# Patient Record
Sex: Female | Born: 1940 | Race: Black or African American | Hispanic: No | Marital: Married | Smoking: Never smoker
Health system: Southern US, Community
[De-identification: ages and names within clinical notes are randomized; demographics above are authoritative.]

## PROBLEM LIST (undated history)

## (undated) DIAGNOSIS — I10 Essential (primary) hypertension: Secondary | ICD-10-CM

## (undated) HISTORY — PX: ROTATOR CUFF REPAIR: SHX139

## (undated) HISTORY — PX: ABDOMINAL SURGERY: SHX537

## (undated) HISTORY — PX: INNER EAR SURGERY: SHX679

---

## 2012-05-27 ENCOUNTER — Ambulatory Visit (INDEPENDENT_AMBULATORY_CARE_PROVIDER_SITE_OTHER): Payer: Medicare Other | Admitting: Otolaryngology

## 2012-05-27 DIAGNOSIS — H7010 Chronic mastoiditis, unspecified ear: Secondary | ICD-10-CM

## 2012-05-27 DIAGNOSIS — H906 Mixed conductive and sensorineural hearing loss, bilateral: Secondary | ICD-10-CM

## 2012-05-27 DIAGNOSIS — H95129 Granulation of postmastoidectomy cavity, unspecified ear: Secondary | ICD-10-CM

## 2012-06-17 ENCOUNTER — Ambulatory Visit (INDEPENDENT_AMBULATORY_CARE_PROVIDER_SITE_OTHER): Payer: Medicare Other | Admitting: Otolaryngology

## 2012-06-17 DIAGNOSIS — H70009 Acute mastoiditis without complications, unspecified ear: Secondary | ICD-10-CM

## 2012-06-17 DIAGNOSIS — H903 Sensorineural hearing loss, bilateral: Secondary | ICD-10-CM

## 2012-07-15 ENCOUNTER — Ambulatory Visit (INDEPENDENT_AMBULATORY_CARE_PROVIDER_SITE_OTHER): Payer: Medicare Other | Admitting: Otolaryngology

## 2012-07-15 DIAGNOSIS — H95129 Granulation of postmastoidectomy cavity, unspecified ear: Secondary | ICD-10-CM

## 2012-10-14 ENCOUNTER — Ambulatory Visit (INDEPENDENT_AMBULATORY_CARE_PROVIDER_SITE_OTHER): Payer: Medicare Other | Admitting: Otolaryngology

## 2012-12-09 ENCOUNTER — Ambulatory Visit (INDEPENDENT_AMBULATORY_CARE_PROVIDER_SITE_OTHER): Payer: Medicare Other | Admitting: Otolaryngology

## 2013-05-26 ENCOUNTER — Ambulatory Visit (INDEPENDENT_AMBULATORY_CARE_PROVIDER_SITE_OTHER): Payer: Medicare Other | Admitting: Otolaryngology

## 2013-05-26 DIAGNOSIS — T169XXA Foreign body in ear, unspecified ear, initial encounter: Secondary | ICD-10-CM

## 2013-05-26 DIAGNOSIS — H608X9 Other otitis externa, unspecified ear: Secondary | ICD-10-CM

## 2013-05-26 DIAGNOSIS — H6060 Unspecified chronic otitis externa, unspecified ear: Secondary | ICD-10-CM

## 2013-12-08 ENCOUNTER — Emergency Department (HOSPITAL_COMMUNITY): Payer: Medicare Other

## 2013-12-08 ENCOUNTER — Encounter (HOSPITAL_COMMUNITY): Payer: Self-pay | Admitting: Emergency Medicine

## 2013-12-08 ENCOUNTER — Emergency Department (HOSPITAL_COMMUNITY)
Admission: EM | Admit: 2013-12-08 | Discharge: 2013-12-08 | Disposition: A | Discharge status: Home / Self Care | Payer: Medicare Other | Attending: Emergency Medicine | Admitting: Emergency Medicine

## 2013-12-08 DIAGNOSIS — Z79899 Other long term (current) drug therapy: Secondary | ICD-10-CM | POA: Diagnosis not present

## 2013-12-08 DIAGNOSIS — M25551 Pain in right hip: Secondary | ICD-10-CM

## 2013-12-08 DIAGNOSIS — M25559 Pain in unspecified hip: Secondary | ICD-10-CM | POA: Insufficient documentation

## 2013-12-08 DIAGNOSIS — Z7982 Long term (current) use of aspirin: Secondary | ICD-10-CM | POA: Diagnosis not present

## 2013-12-08 DIAGNOSIS — R52 Pain, unspecified: Secondary | ICD-10-CM | POA: Insufficient documentation

## 2013-12-08 DIAGNOSIS — I10 Essential (primary) hypertension: Secondary | ICD-10-CM | POA: Diagnosis not present

## 2013-12-08 HISTORY — DX: Essential (primary) hypertension: I10

## 2013-12-08 MED ORDER — TRAMADOL HCL 50 MG PO TABS: 50.0000 mg | ORAL_TABLET | Freq: Four times a day (QID) | ORAL | Status: AC | PRN

## 2013-12-08 MED ORDER — TRAMADOL HCL 50 MG PO TABS
50.0000 mg | ORAL_TABLET | Freq: Once | ORAL | Status: AC
  Administered 2013-12-08: 50 mg via ORAL
  Filled 2013-12-08: qty 1

## 2013-12-08 NOTE — ED Notes (Signed)
Pt reports right hip pain x1.5 weeks. Pt reports went to sit down in a chair and fall backwards. Pt reports right hip pain ever since. nad noted. Pt denies loc or hitting head.

## 2013-12-08 NOTE — Discharge Instructions (Signed)
Hip Pain Your hip is the joint between your upper legs and your lower pelvis. The bones, cartilage, tendons, and muscles of your hip joint perform a lot of work each day supporting your body weight and allowing you to move around. Hip pain can range from a minor ache to severe pain in one or both of your hips. Pain may be felt on the inside of the hip joint near the groin, or the outside near the buttocks and upper thigh. You may have swelling or stiffness as well.  HOME CARE INSTRUCTIONS   Take medicines only as directed by your health care provider.  Apply ice to the injured area:  Put ice in a plastic bag.  Place a towel between your skin and the bag.  Leave the ice on for 15-20 minutes at a time, 3-4 times a day.  Keep your leg raised (elevated) when possible to lessen swelling.  Avoid activities that cause pain.  Follow specific exercises as directed by your health care provider.  Sleep with a pillow between your legs on your most comfortable side.  Record how often you have hip pain, the location of the pain, and what it feels like. SEEK MEDICAL CARE IF:   You are unable to put weight on your leg.  Your hip is red or swollen or very tender to touch.  Your pain or swelling continues or worsens after 1 week.  You have increasing difficulty walking.  You have a fever. SEEK IMMEDIATE MEDICAL CARE IF:   You have fallen.  You have a sudden increase in pain and swelling in your hip. MAKE SURE YOU:   Understand these instructions.  Will watch your condition.  Will get help right away if you are not doing well or get worse. Document Released: 09/25/2009 Document Revised: 08/22/2013 Document Reviewed: 12/02/2012 [employer] Patient Information 2015 [employer]. This information is not intended to replace advice given to you by your health care provider. Make sure you discuss any questions you have with your health care provider.  

## 2013-12-08 NOTE — ED Notes (Signed)
Pt states she fell 7-10 days ago and her hip has been hurting ever since. States she has been managing the pain with anacin but that it has not gone away so she came to ED. Pt denies hitting her head or losing consciousness. NAD.

## 2013-12-08 NOTE — ED Notes (Signed)
Pt discharged to home with family. Pt's BP remains high but pt feels fine. Pt given warning signs for stroke and has verbalized understanding. Pt also understands that she needs to follow up with primary care doctor to evaluate BP. NAD.

## 2013-12-08 NOTE — ED Provider Notes (Signed)
CSN: [id number]     Arrival date & time 12/08/13  0825 History   First MD Initiated Contact with Patient 12/08/13 [phone]     Chief Complaint  Patient presents with  . Hip Pain   Whitney Mcbride is a 73 y.o. female who presents to the Emergency Department complaining of posterior right hip pain after a fall from a chair one week ago.  She denies other injuries.  Pain improves with aspirin and after ambulation.    (Consider location/radiation/quality/duration/timing/severity/associated sxs/prior Treatment) Patient is a 73 y.o. female presenting with hip pain.  Hip Pain This is a new problem. Episode onset: one week ago. The problem occurs constantly. The problem has been unchanged. Associated symptoms include arthralgias. Pertinent negatives include no abdominal pain, chest pain, chills, fever, headaches, joint swelling, nausea, neck pain, numbness, rash, vertigo, vomiting or weakness. The symptoms are aggravated by walking and standing. She has tried NSAIDs for the symptoms. The treatment provided mild relief.    Past Medical History  Diagnosis Date  . Hypertension    Past Surgical History  Procedure Laterality Date  . Rotator cuff repair    . Abdominal surgery    . Inner ear surgery     History reviewed. No pertinent family history. History  Substance Use Topics  . Smoking status: Never Smoker   . Smokeless tobacco: Not on file  . Alcohol Use: No   OB History   Grav Para Term Preterm Abortions TAB SAB Ect Mult Living                 Review of Systems  Constitutional: Negative for fever and chills.  Respiratory: Negative for chest tightness.   Cardiovascular: Negative for chest pain.  Gastrointestinal: Negative for nausea, vomiting and abdominal pain.  Genitourinary: Negative for dysuria, hematuria and difficulty urinating.  Musculoskeletal: Positive for arthralgias. Negative for joint swelling and neck pain.       Right hip pain  Skin: Negative for color change, rash and  wound.  Neurological: Negative for vertigo, weakness, numbness and headaches.  All other systems reviewed and are negative.     Allergies  Review of patient's allergies indicates no known allergies.  Home Medications   Prior to Admission medications   Medication Sig Start Date End Date Taking? Authorizing Provider  Aspirin (ANACIN PO) Take 1 tablet by mouth daily as needed (pain).   Yes Historical Provider, MD  losartan-hydrochlorothiazide (HYZAAR) 50-12.5 MG per tablet Take 1 tablet by mouth daily.   Yes Historical Provider, MD  nystatin cream (MYCOSTATIN) Apply 1 application topically daily as needed (under breasts).   Yes Historical Provider, MD  omeprazole (PRILOSEC) 20 MG capsule Take 20 mg by mouth daily.   Yes Historical Provider, MD   BP 199/78  Pulse 83  Temp(Src) 98.2 F (36.8 C) (Oral)  Resp 18  Ht 5\' 5"  (1.651 m)  Wt 155 lb (70.308 kg)  BMI 25.79 kg/m2  SpO2 99% Physical Exam  Nursing note and vitals reviewed. Constitutional: She is oriented to person, place, and time. She appears well-developed and well-nourished. No distress.  HENT:  Head: Normocephalic and atraumatic.  Neck: Normal range of motion. Neck supple.  Cardiovascular: Normal rate, regular rhythm, normal heart sounds and intact distal pulses.   No murmur heard. Pulmonary/Chest: Effort normal and breath sounds normal. No respiratory distress. She exhibits no tenderness.  Abdominal: Soft. She exhibits no distension. There is no tenderness. There is no rebound and no guarding.  Musculoskeletal: She exhibits tenderness.  She exhibits no edema.       Lumbar back: She exhibits tenderness and pain. She exhibits normal range of motion, no swelling, no deformity, no laceration and normal pulse.  ttp of the posterior right hip.  No spinal tenderness.  DP pulses are brisk and symmetrical. Pt has full ROM of the right hip.   Distal sensation intact.  Hip Flexors/Extensors are intact.    Neurological: She is alert  and oriented to person, place, and time. She has normal strength. No sensory deficit. She exhibits normal muscle tone. Coordination and gait normal.  Reflex Scores:      Patellar reflexes are 2+ on the right side and 2+ on the left side.      Achilles reflexes are 2+ on the right side and 2+ on the left side. Skin: Skin is warm and dry. No rash noted.    ED Course  Procedures (including critical care time) Labs Review Labs Reviewed - No data to display  Imaging Review Dg Hip Complete Right  12/08/2013   CLINICAL DATA:  Right hip pain after fall.  EXAM: RIGHT HIP - COMPLETE 2+ VIEW  COMPARISON:  None.  FINDINGS: There is no evidence of hip fracture or dislocation. There is no evidence of arthropathy or other focal bone abnormality.  IMPRESSION: Normal right hip.   Electronically Signed   By: Roque LiasJames  Green M.D.   On: 12/08/2013 09:03     EKG Interpretation None      MDM   Final diagnoses:  Hip pain, acute, right      pt is alert, well appearing.  Ambulates in the dept with a steady gait.  No focal neuro deficits, has full ROM of the hip joint on exam and pain improves with aspirin.  No concerning sx's for septic joint.  Pain improved after medication.  rx's for ultram.  Pt advised that her BP here is elevated as well, but patient only complaint today is hip pain and states that her PMD is aware that her BP has been "up and down".  She appears stable for d/c and agrees to arrange close f/u with her PMD regarding her HTN.     Shanan Fitzpatrick L. Ardella Chhim, PA-C 12/10/13 0028

## 2013-12-12 NOTE — ED Provider Notes (Signed)
Medical screening examination/treatment/procedure(s) were performed by non-physician practitioner and as supervising physician I was immediately available for consultation/collaboration.   EKG Interpretation None        Amyra Vantuyl L Jayven Naill, MD 12/12/13 1702 

## 2015-12-20 IMAGING — CR DG HIP COMPLETE 2+V*R*
3 series · 3 of 3 positions shown · non-contrast
Comparison: None.

CLINICAL DATA: Right hip pain after fall.

EXAM:
RIGHT HIP - COMPLETE 2+ VIEW

[view not recorded (1 of 3)]
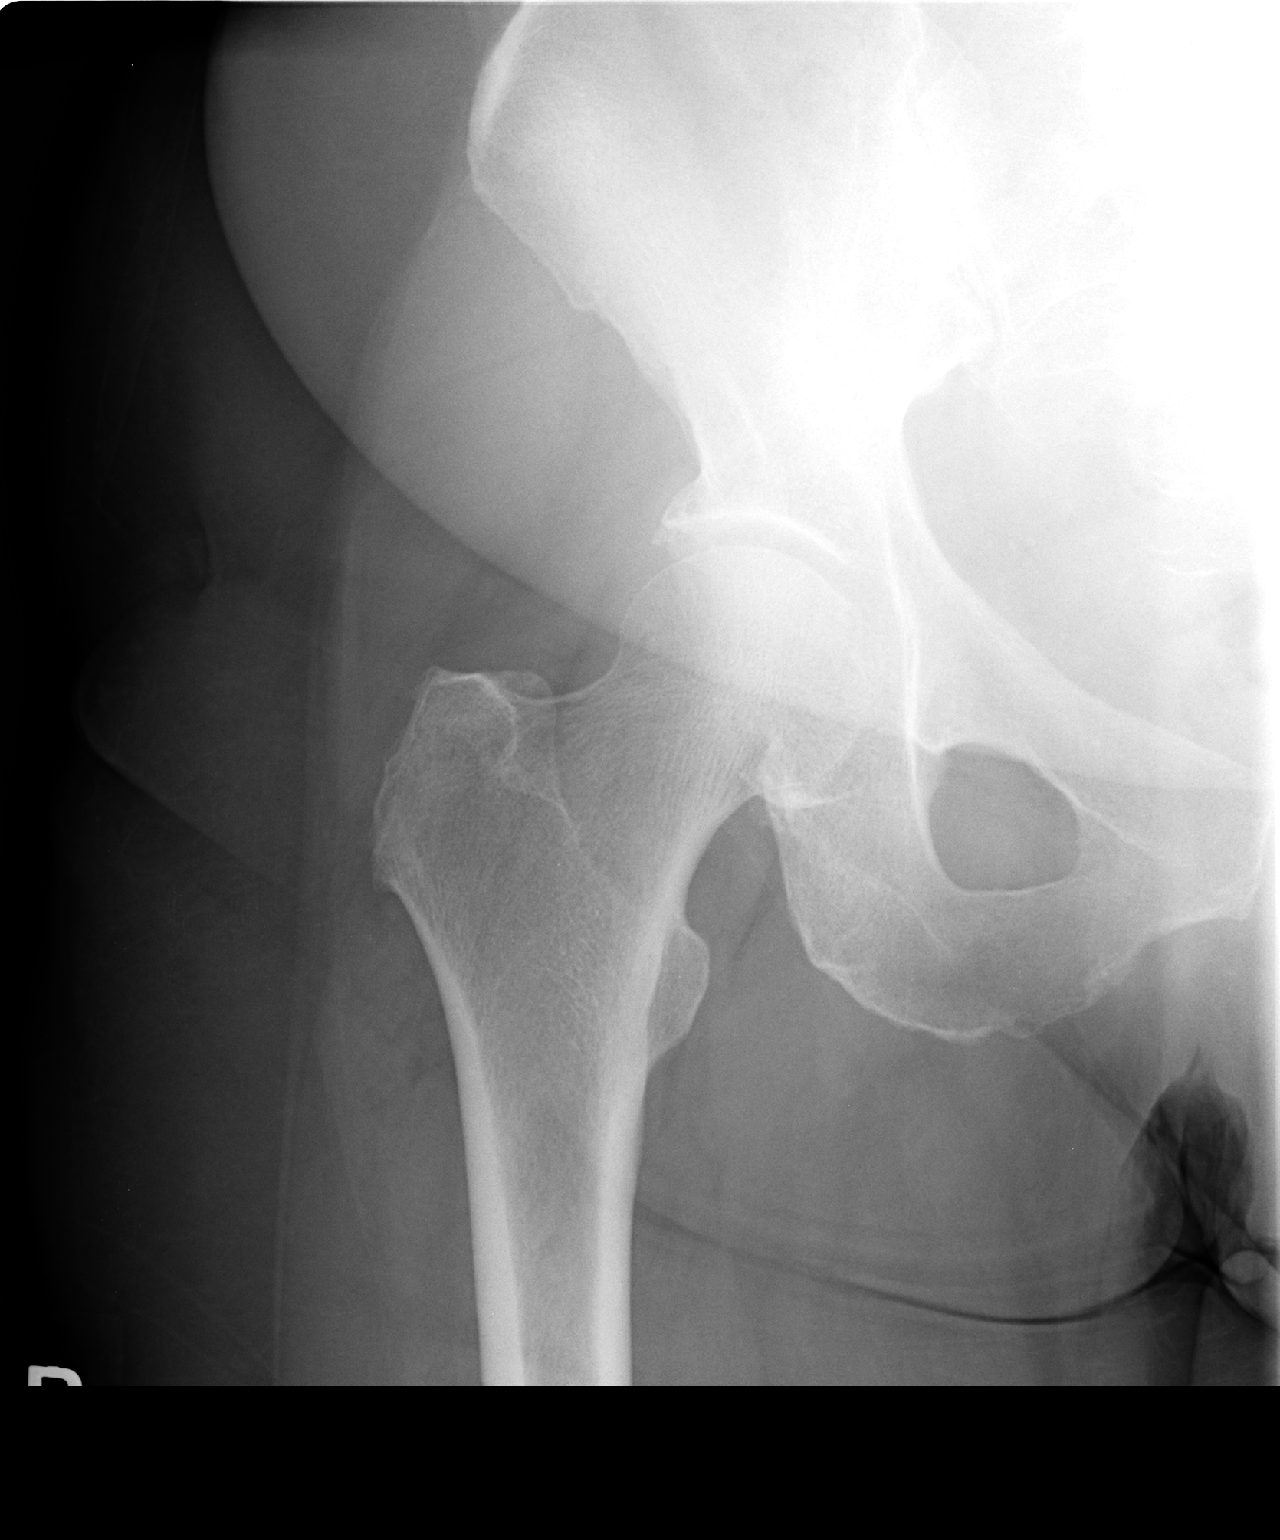

[view not recorded (2 of 3)]
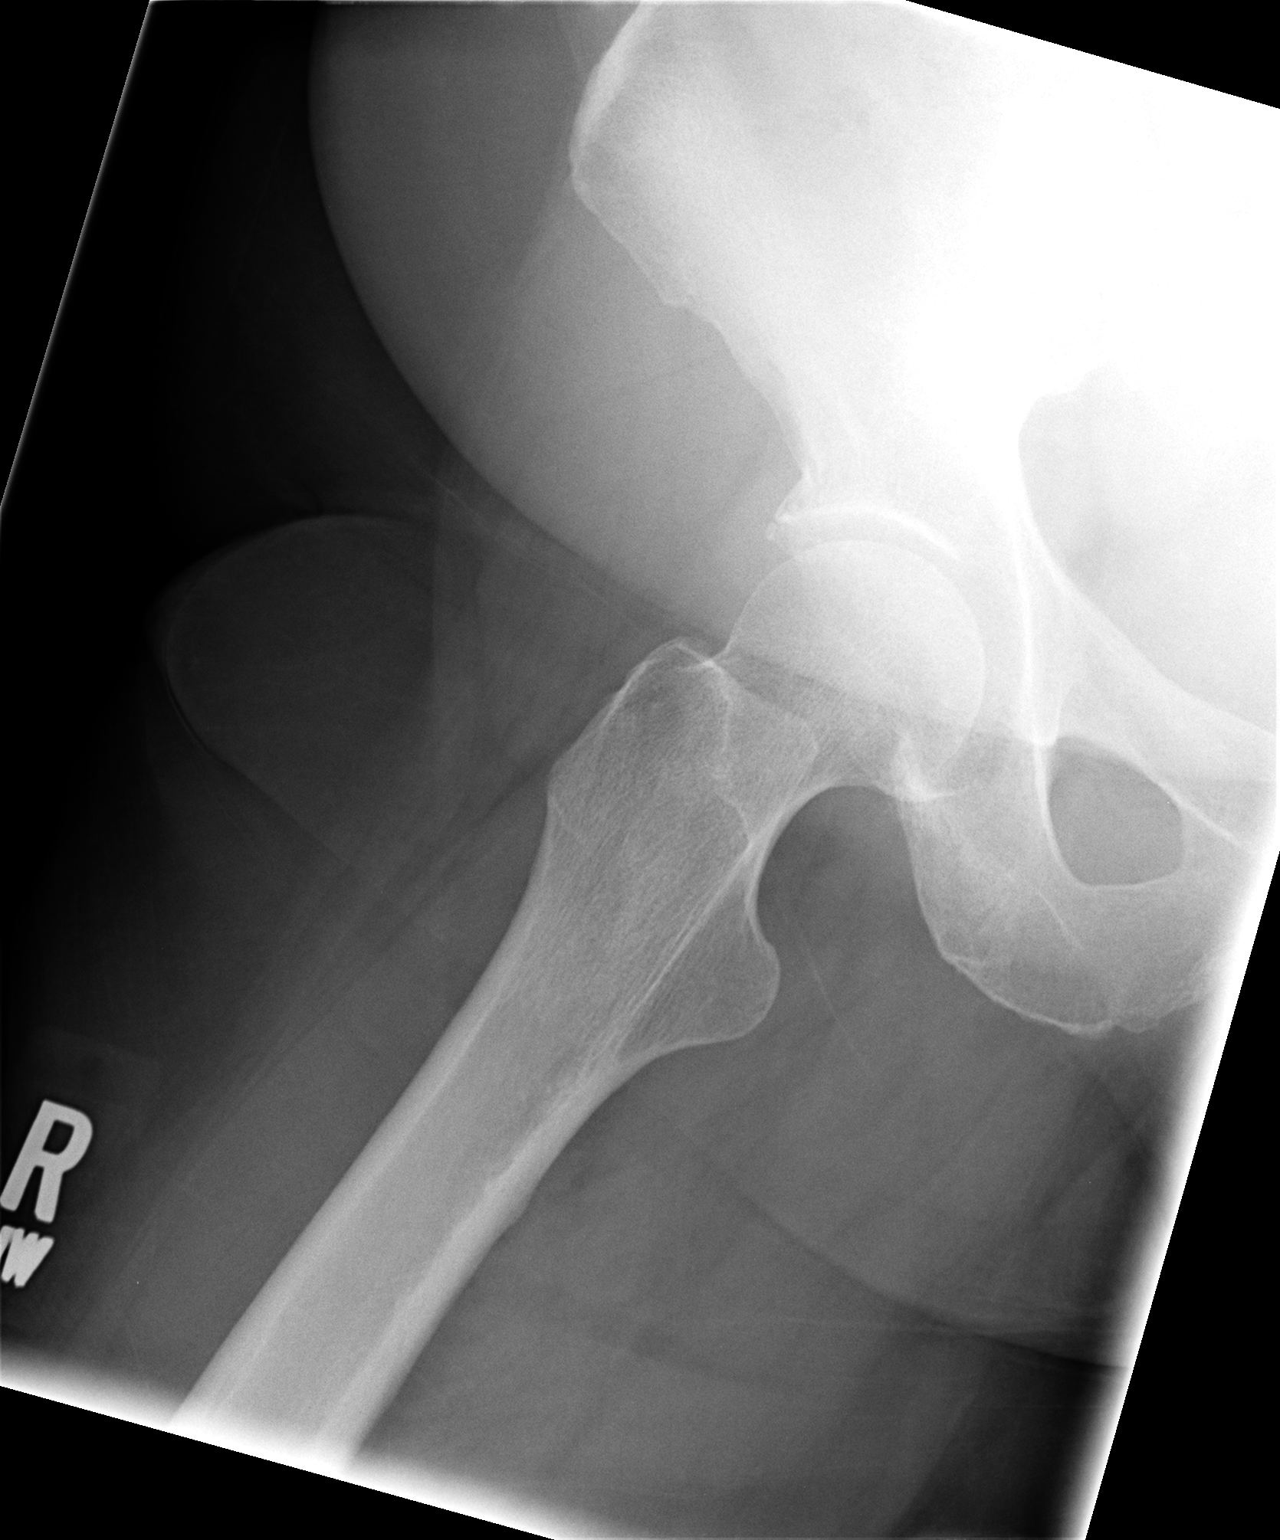

[view not recorded (3 of 3)]
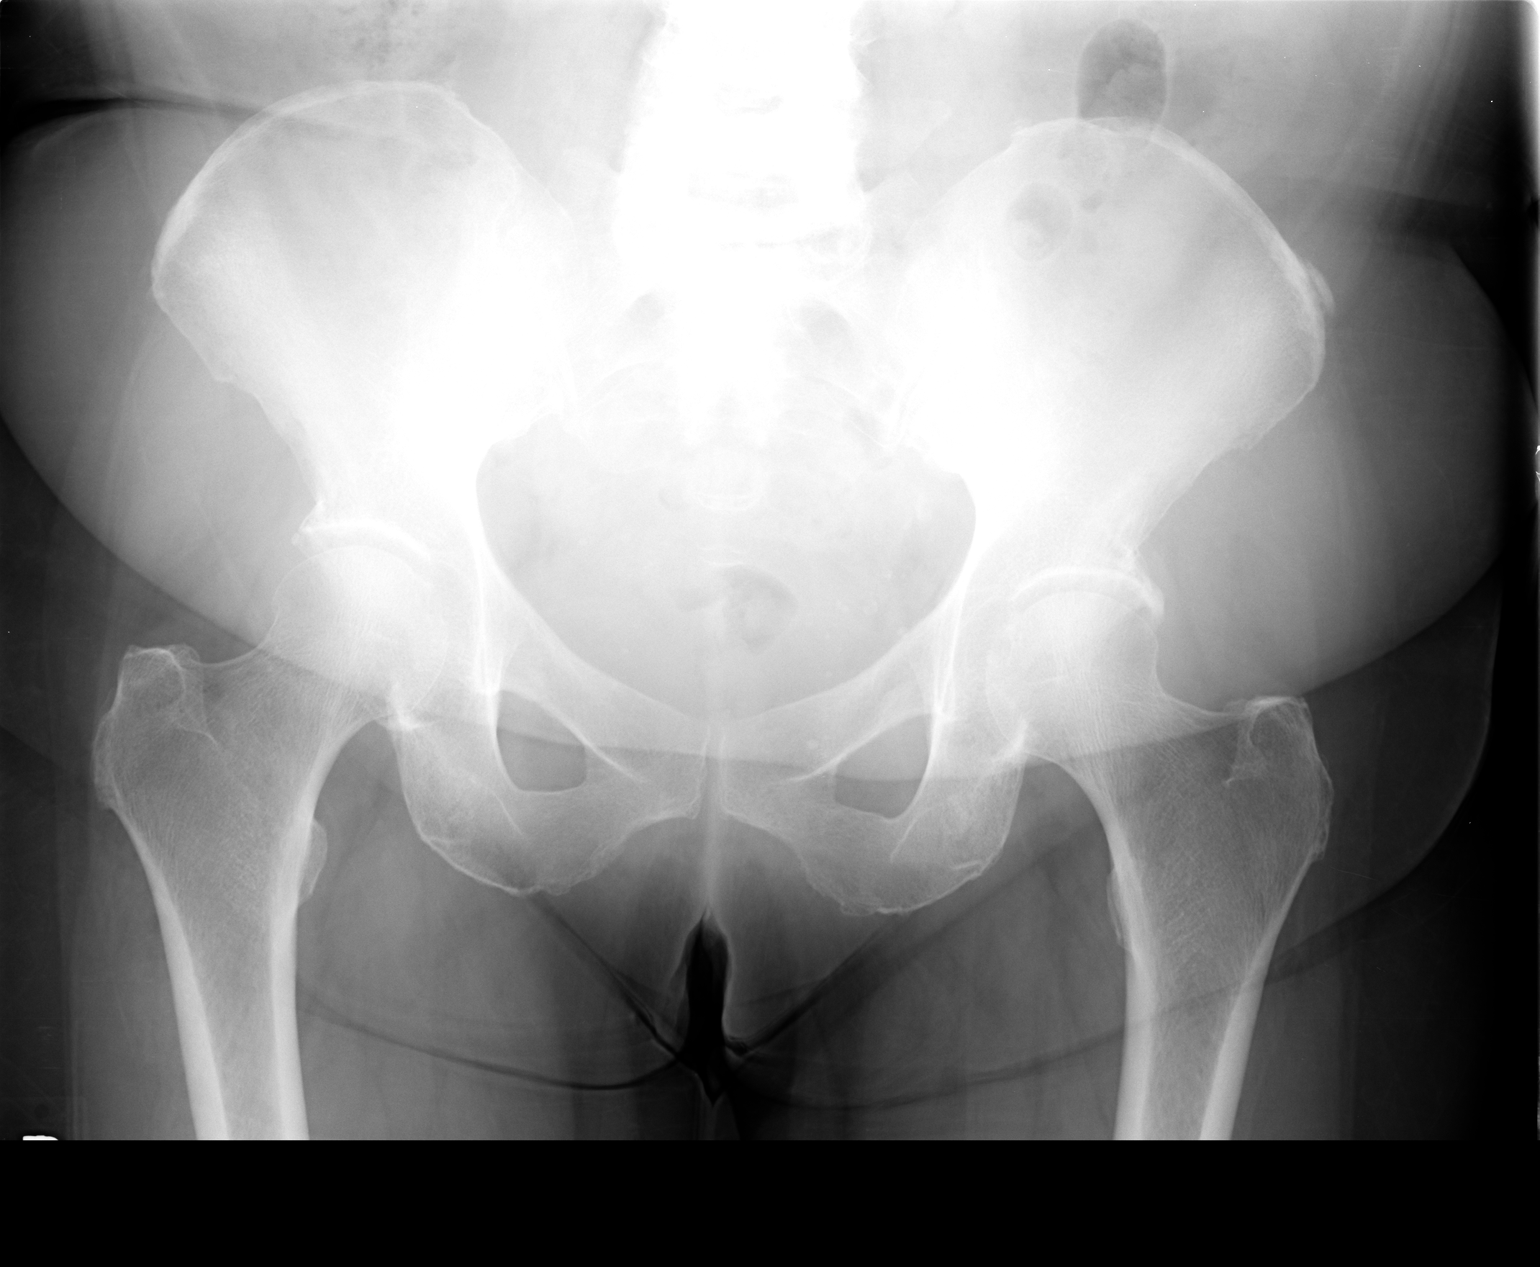

[3 of 3 positions shown; findings below may reference images not displayed]

FINDINGS: There is no evidence of hip fracture or dislocation. There is no
evidence of arthropathy or other focal bone abnormality.
IMPRESSION: Normal right hip.
